# Patient Record
Sex: Female | Born: 1985 | Hispanic: No | Marital: Single
Health system: Southern US, Community
[De-identification: ages and names within clinical notes are randomized; demographics above are authoritative.]

---
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
## 2020-03-09 ENCOUNTER — Ambulatory Visit: Payer: Self-pay | Attending: Internal Medicine

## 2020-03-09 DIAGNOSIS — Z23 Encounter for immunization: Secondary | ICD-10-CM

## 2020-03-09 NOTE — Progress Notes (Signed)
   Covid-19 Vaccination Clinic  Name:  Ayza Ripoll    MRN: [id number] DOB: 1986/07/30  03/09/2020  [name] was observed post Covid-19 immunization for 15 minutes without incident. She was provided with Vaccine Information Sheet and instruction to access the V-Safe system.   [name] was instructed to call 911 with any severe reactions post vaccine: Marland Kitchen Difficulty breathing  . Swelling of face and throat  . A fast heartbeat  . A bad rash all over body  . Dizziness and weakness   Immunizations Administered    Name Date Dose VIS Date Route   Pfizer COVID-19 Vaccine 03/09/2020  8:41 AM 0.3 mL 12/08/2019 Intramuscular   Manufacturer: [employer], [employer]   Lot: [id number]   NDC: [address]-5

## 2020-04-01 ENCOUNTER — Ambulatory Visit: Payer: Self-pay | Attending: Internal Medicine

## 2020-04-01 ENCOUNTER — Ambulatory Visit: Payer: Self-pay

## 2020-04-01 DIAGNOSIS — Z23 Encounter for immunization: Secondary | ICD-10-CM

## 2020-04-01 NOTE — Progress Notes (Signed)
   Covid-19 Vaccination Clinic  Name:  April Hartman    MRN: [id number] DOB: [birth year]  04/01/2020  [name] was observed post Covid-19 immunization for 15 minutes without incident. She was provided with Vaccine Information Sheet and instruction to access the V-Safe system.   [name] was instructed to call 911 with any severe reactions post vaccine: Marland Kitchen Difficulty breathing  . Swelling of face and throat  . A fast heartbeat  . A bad rash all over body  . Dizziness and weakness   Immunizations Administered    Name Date Dose VIS Date Route   Pfizer COVID-19 Vaccine 04/01/2020  3:42 PM 0.3 mL 12/08/2019 Intramuscular   Manufacturer: [employer], [employer]   Lot: [id number]   NDC: [address]-7

## 2021-12-02 ENCOUNTER — Other Ambulatory Visit: Payer: Self-pay | Admitting: Nurse Practitioner

## 2021-12-02 DIAGNOSIS — N852 Hypertrophy of uterus: Secondary | ICD-10-CM

## 2021-12-02 DIAGNOSIS — Z30431 Encounter for routine checking of intrauterine contraceptive device: Secondary | ICD-10-CM

## 2021-12-12 ENCOUNTER — Emergency Department (HOSPITAL_COMMUNITY)
Admission: EM | Admit: 2021-12-12 | Discharge: 2021-12-12 | Disposition: A | Discharge status: Home / Self Care | Attending: Emergency Medicine | Admitting: Emergency Medicine

## 2021-12-12 ENCOUNTER — Other Ambulatory Visit: Payer: Self-pay

## 2021-12-12 DIAGNOSIS — S0990XA Unspecified injury of head, initial encounter: Secondary | ICD-10-CM | POA: Diagnosis present

## 2021-12-12 DIAGNOSIS — Z23 Encounter for immunization: Secondary | ICD-10-CM | POA: Diagnosis not present

## 2021-12-12 DIAGNOSIS — W228XXA Striking against or struck by other objects, initial encounter: Secondary | ICD-10-CM | POA: Diagnosis not present

## 2021-12-12 DIAGNOSIS — Y990 Civilian activity done for income or pay: Secondary | ICD-10-CM | POA: Insufficient documentation

## 2021-12-12 DIAGNOSIS — S0101XA Laceration without foreign body of scalp, initial encounter: Secondary | ICD-10-CM | POA: Diagnosis not present

## 2021-12-12 DIAGNOSIS — S0181XA Laceration without foreign body of other part of head, initial encounter: Secondary | ICD-10-CM

## 2021-12-12 MED ORDER — LIDOCAINE-EPINEPHRINE (PF) 2 %-1:200000 IJ SOLN
10.0000 mL | Freq: Once | INTRAMUSCULAR | Status: AC
  Administered 2021-12-12: 10 mL
  Filled 2021-12-12: qty 20

## 2021-12-12 MED ORDER — TETANUS-DIPHTH-ACELL PERTUSSIS 5-2.5-18.5 LF-MCG/0.5 IM SUSY
0.5000 mL | PREFILLED_SYRINGE | Freq: Once | INTRAMUSCULAR | Status: AC
  Administered 2021-12-12: 0.5 mL via INTRAMUSCULAR
  Filled 2021-12-12: qty 0.5

## 2021-12-12 NOTE — Discharge Instructions (Signed)
Return for any problem.   The sutures placed today need to be removed in approximately 5 to 7 days.  This can be done either here in the ED or at your regular care provider or at an urgent care.
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
## 2021-12-18 ENCOUNTER — Ambulatory Visit
Admission: RE | Admit: 2021-12-18 | Discharge: 2021-12-18 | Disposition: A | Discharge status: Home / Self Care | Payer: 59 | Source: Ambulatory Visit | Attending: Nurse Practitioner | Admitting: Nurse Practitioner

## 2021-12-18 DIAGNOSIS — Z30431 Encounter for routine checking of intrauterine contraceptive device: Secondary | ICD-10-CM

## 2021-12-18 DIAGNOSIS — N852 Hypertrophy of uterus: Secondary | ICD-10-CM

## 2022-03-20 ENCOUNTER — Ambulatory Visit
Admission: RE | Admit: 2022-03-20 | Discharge: 2022-03-20 | Disposition: A | Discharge status: Home / Self Care | Payer: BC Managed Care – PPO | Source: Ambulatory Visit | Attending: Family Medicine | Admitting: Family Medicine

## 2022-03-20 ENCOUNTER — Other Ambulatory Visit: Payer: Self-pay | Admitting: Family Medicine

## 2022-03-20 DIAGNOSIS — M546 Pain in thoracic spine: Secondary | ICD-10-CM

## 2022-03-20 DIAGNOSIS — M542 Cervicalgia: Secondary | ICD-10-CM

## 2022-03-20 DIAGNOSIS — M545 Low back pain, unspecified: Secondary | ICD-10-CM

## 2022-12-22 IMAGING — CR DG CERVICAL SPINE COMPLETE 4+V
5 series · 5 of 5 positions shown · non-contrast
Comparison: None.

CLINICAL DATA: Cervical spine pain.  Bilateral shoulder pain.

EXAM:
CERVICAL SPINE - COMPLETE 4+ VIEW

[w cervical spine lat]
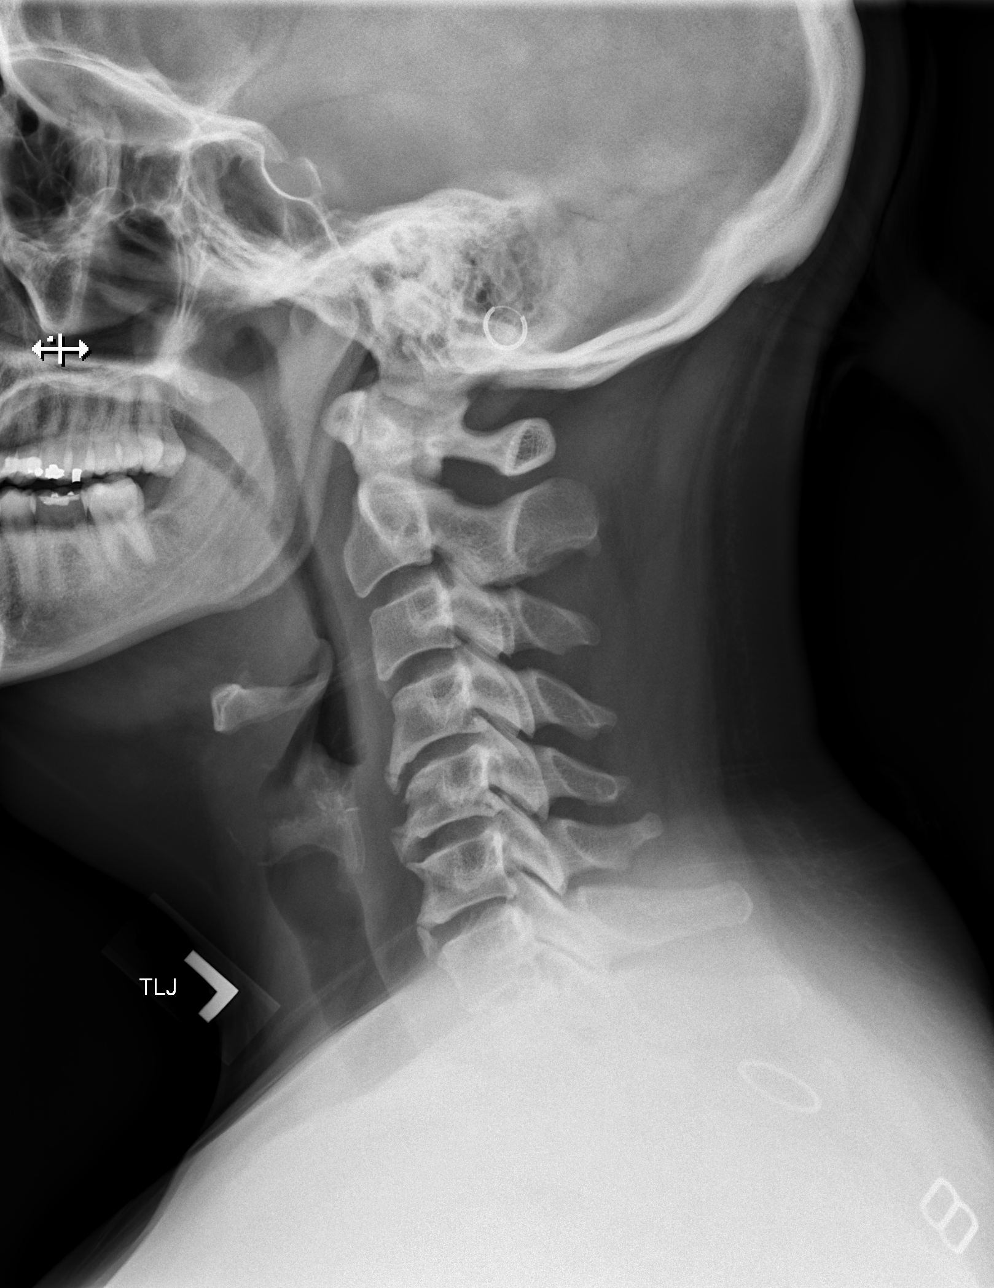

[w cervical spine ap_obl (1 of 2)]
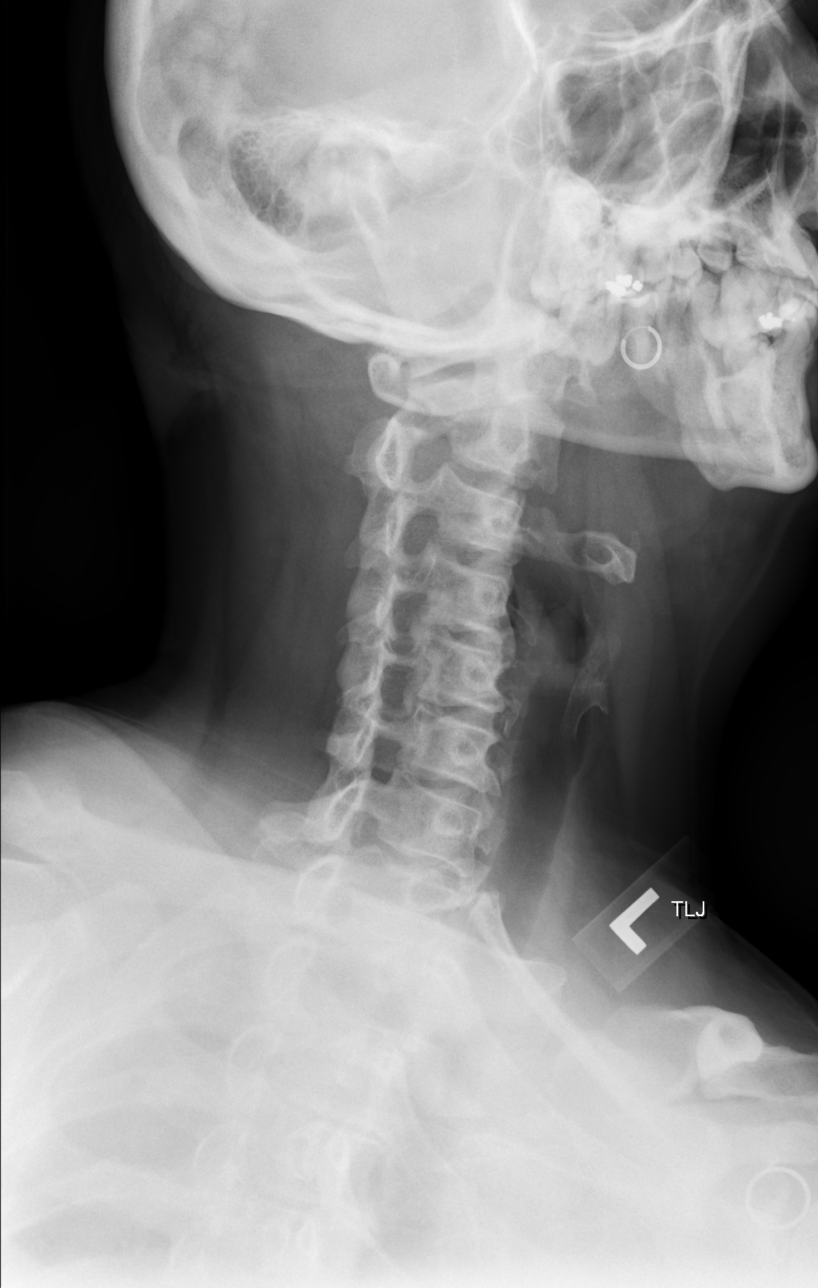

[w cervical spine ap_obl (2 of 2)]
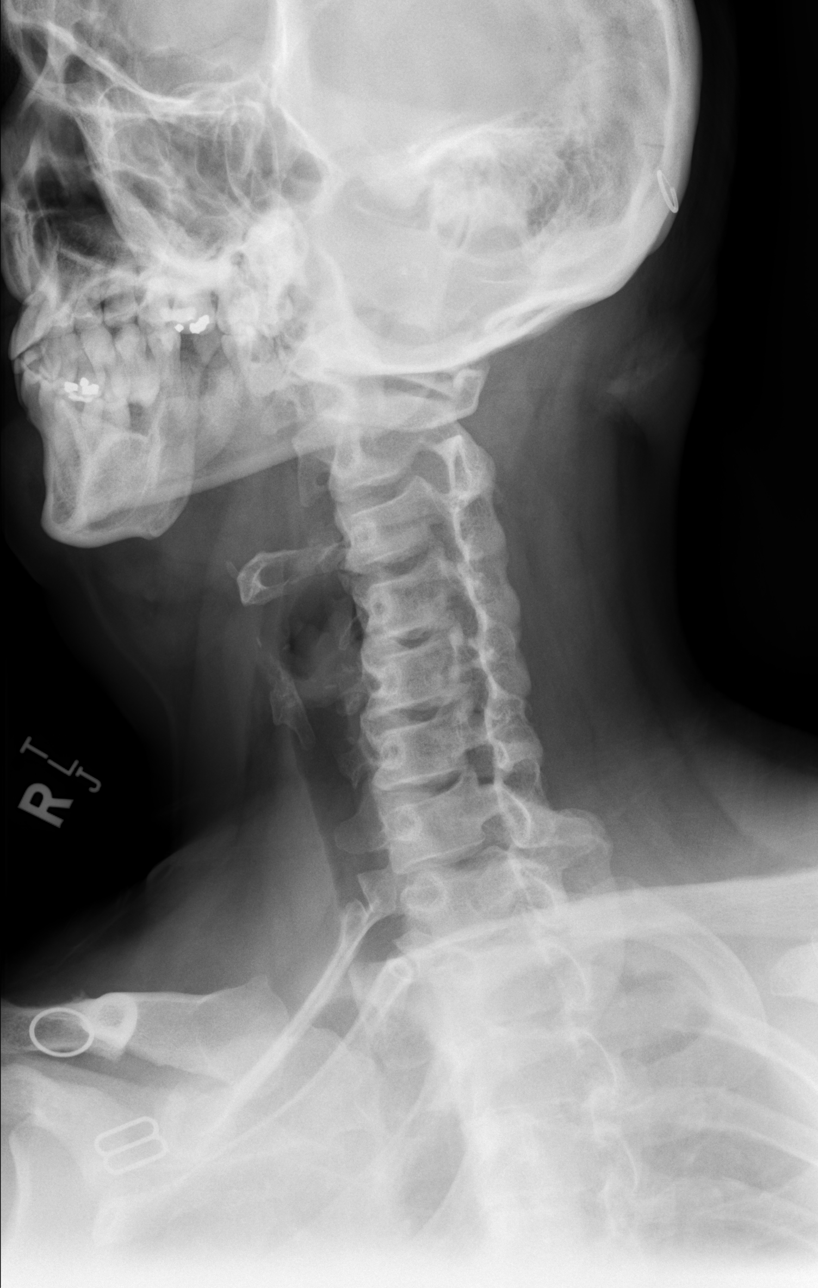

[w cervical spine ap]
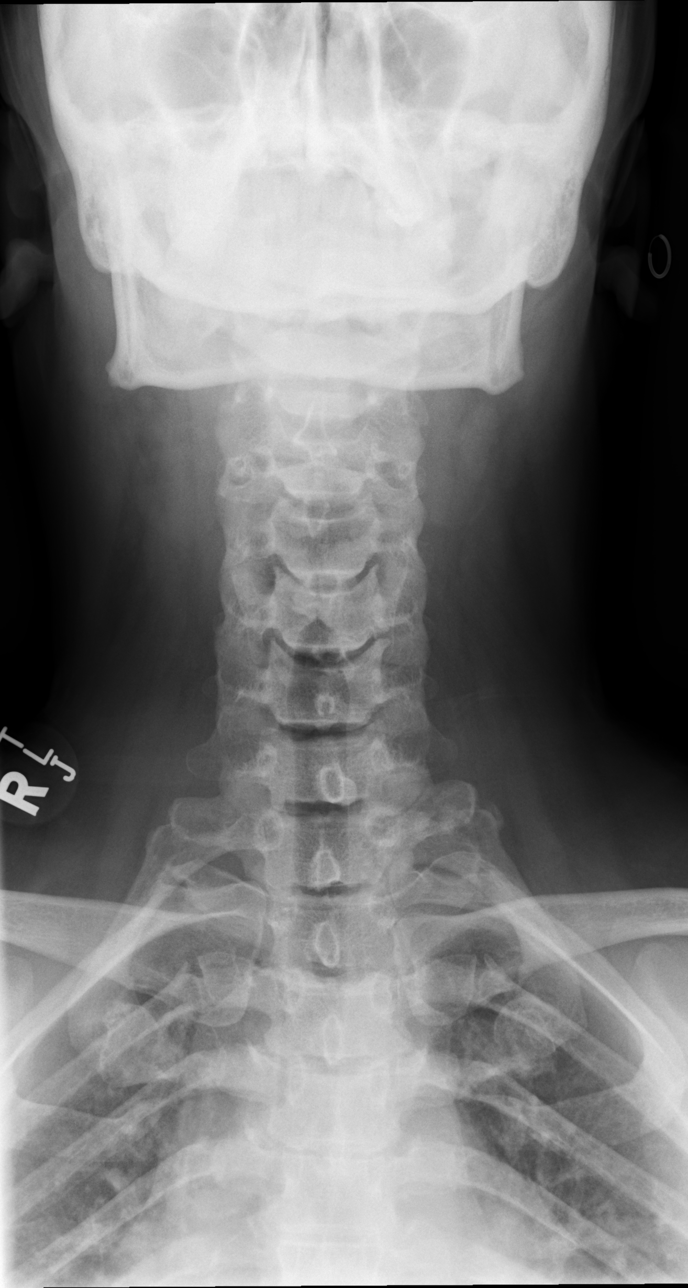

[w cervical spine odontoid]
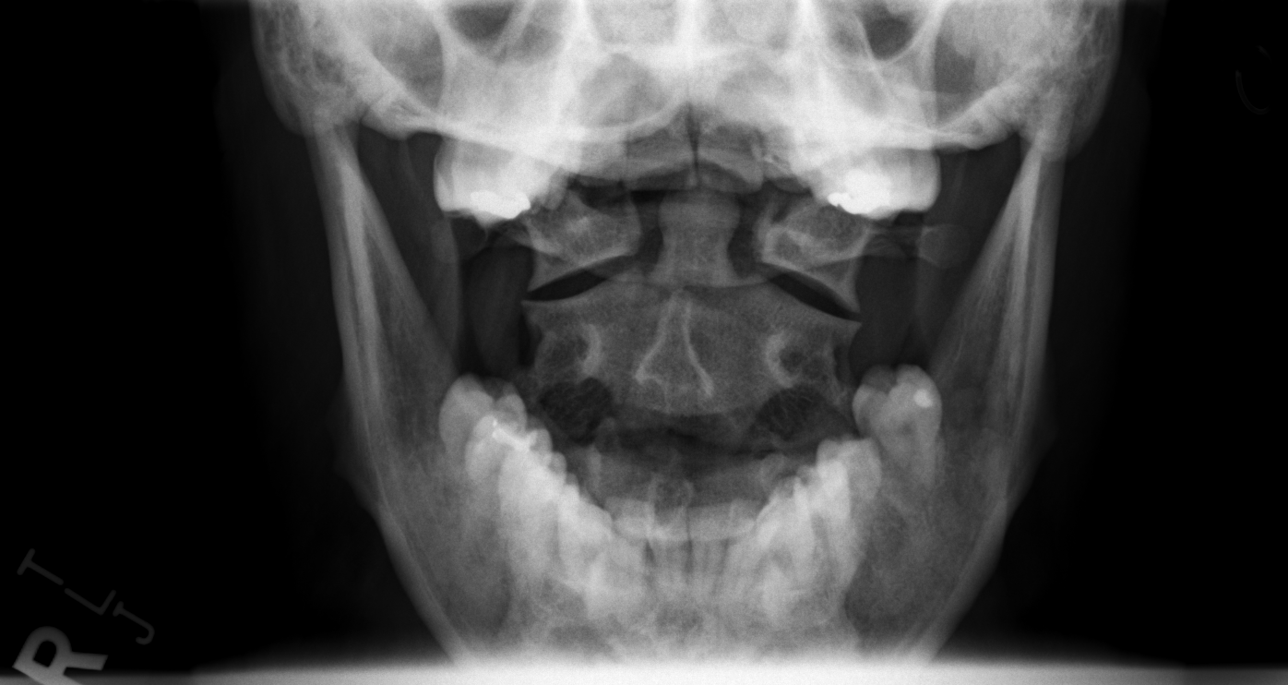

[5 of 5 positions shown; findings below may reference images not displayed]

FINDINGS: There is no evidence of cervical spine fracture or prevertebral soft
tissue swelling. Straightening of the cervical spine. Disc space
narrowing with anterior osteophytes prominent at C4-C5, C5-C6 and
C6-C7.
IMPRESSION: 1.  No evidence of fracture or subluxation.

2. Degenerate disc disease of the cervical spine with prominent
anterior osteophytes at C4-C5, C5-C6 and C6-C7.

## 2024-10-16 ENCOUNTER — Other Ambulatory Visit (HOSPITAL_COMMUNITY)
Admission: RE | Admit: 2024-10-16 | Discharge: 2024-10-16 | Disposition: A | Discharge status: Home / Self Care | Source: Ambulatory Visit | Attending: Obstetrics | Admitting: Obstetrics

## 2024-10-16 ENCOUNTER — Encounter: Payer: Self-pay | Admitting: Obstetrics

## 2024-10-16 ENCOUNTER — Ambulatory Visit (INDEPENDENT_AMBULATORY_CARE_PROVIDER_SITE_OTHER): Admitting: Obstetrics

## 2024-10-16 VITALS — BP 136/80 | HR 75 | Ht 66.5 in | Wt 254.5 lb

## 2024-10-16 DIAGNOSIS — Z01419 Encounter for gynecological examination (general) (routine) without abnormal findings: Secondary | ICD-10-CM | POA: Diagnosis not present

## 2024-10-16 DIAGNOSIS — E6609 Other obesity due to excess calories: Secondary | ICD-10-CM | POA: Diagnosis not present

## 2024-10-16 DIAGNOSIS — Z30011 Encounter for initial prescription of contraceptive pills: Secondary | ICD-10-CM

## 2024-10-16 DIAGNOSIS — R102 Pelvic and perineal pain unspecified side: Secondary | ICD-10-CM | POA: Diagnosis not present

## 2024-10-16 DIAGNOSIS — N946 Dysmenorrhea, unspecified: Secondary | ICD-10-CM

## 2024-10-16 DIAGNOSIS — Z3042 Encounter for surveillance of injectable contraceptive: Secondary | ICD-10-CM

## 2024-10-16 MED ORDER — NORETHINDRONE-ETH ESTRADIOL 1-35 MG-MCG PO TABS: 1.0000 | ORAL_TABLET | Freq: Every day | ORAL | 11 refills | Status: AC

## 2024-10-16 MED ORDER — IBUPROFEN 800 MG PO TABS: 800.0000 mg | ORAL_TABLET | Freq: Three times a day (TID) | ORAL | 5 refills | Status: AC | PRN

## 2024-10-16 NOTE — Progress Notes (Signed)
 Subjective:        April Hartman is a 38 y.o. female here for a routine exam.  Current complaints: Heavy, painful and irregular periods  Undesirable weight gain.  Contraception:  Depo Provera Injections.    Personal health questionnaire:  Is patient April Hartman, have a family history of breast and/or ovarian cancer: no Is there a family history of uterine cancer diagnosed at age < 27, gastrointestinal cancer, urinary tract cancer, family member who is a Personnel officer syndrome-associated carrier: no Is the patient overweight and hypertensive, family history of diabetes, personal history of gestational diabetes, preeclampsia or PCOS: no Is patient over 7, have PCOS,  family history of premature CHD under age 34, diabetes, smoke, have hypertension or peripheral artery disease:  no At any time, has a partner hit, kicked or otherwise hurt or frightened you?: no Over the past 2 weeks, have you felt down, depressed or hopeless?: no Over the past 2 weeks, have you felt little interest or pleasure in doing things?:no   Gynecologic History Patient's last menstrual period was 09/19/2024 (approximate). Contraception: Depo-Provera injections Last Pap: 3-4 yrs ago. Results were: normal Last mammogram: 2025. Results were: normal  Obstetric History OB History  Gravida Para Term Preterm AB Living  4 4 4   4   SAB IAB Ectopic Multiple Live Births          # Outcome Date GA Lbr Len/2nd Weight Sex Type Anes PTL Lv  4 Term           3 Term           2 Term           1 Term             History reviewed. No pertinent past medical history.  History reviewed. No pertinent surgical history.   Current Outpatient Medications:    ibuprofen (ADVIL) 800 MG tablet, Take 1 tablet (800 mg total) by mouth every 8 (eight) hours as needed., Disp: 30 tablet, Rfl: 5   levothyroxine (SYNTHROID) 25 MCG tablet, Take 25 mcg by mouth daily before breakfast., Disp: , Rfl:    metFORMIN (GLUCOPHAGE) 500 MG  tablet, Take 1 tablet by mouth 2 (two) times daily with a meal., Disp: , Rfl:    norethindrone-ethinyl estradiol 1/35 (ORTHO-NOVUM) tablet, Take 1 tablet by mouth daily., Disp: 28 tablet, Rfl: 11 Not on File  Social History   Tobacco Use   Smoking status: Never   Smokeless tobacco: Never  Substance Use Topics   Alcohol use: Not Currently    History reviewed. No pertinent family history.    Review of Systems  Constitutional: negative for fatigue and weight loss Respiratory: negative for cough and wheezing Cardiovascular: negative for chest pain, fatigue and palpitations Gastrointestinal: negative for abdominal pain and change in bowel habits Musculoskeletal:negative for myalgias Neurological: negative for gait problems and tremors Behavioral/Psych: negative for abusive relationship, depression Endocrine: negative for temperature intolerance    Genitourinary: positive for abnormal menstrual periods.  Negative for genital lesions, hot flashes, sexual problems and vaginal discharge Integument/breast: negative for breast lump, breast tenderness, nipple discharge and skin lesion(s)    Objective:       BP 136/80   Pulse 75   Ht 5' 6.5 (1.689 m)   Wt 254 lb 8 oz (115.4 kg)   LMP 09/19/2024 (Approximate)   BMI 40.46 kg/m  General:   Alert and no distress  Skin:   no rash or abnormalities  Lungs:  clear to auscultation bilaterally  Heart:   regular rate and rhythm, S1, S2 normal, no murmur, click, rub or gallop  Breasts:   normal without suspicious masses, skin or nipple changes or axillary nodes  Abdomen:  normal findings: no organomegaly, soft, non-tender and no hernia  Pelvis:  External genitalia: normal general appearance Urinary system: urethral meatus normal and bladder without fullness, nontender Vaginal: normal without tenderness, induration or masses Cervix: normal appearance Adnexa: normal bimanual exam Uterus: anteverted and non-tender, normal size   Lab  Review Urine pregnancy test Labs reviewed yes Radiologic studies reviewed yes  I have spent a total of 20 minutes of face-to-face time, excluding clinical staff time, reviewing notes and preparing to see patient, ordering tests and/or medications, and counseling the patient.   Assessment:    1. Encounter for gynecological examination with Papanicolaou smear of cervix (Primary) Rx: - Cytology - PAP( [place])  2. Dysmenorrhea Rx: - ibuprofen (ADVIL) 800 MG tablet; Take 1 tablet (800 mg total) by mouth every 8 (eight) hours as needed.  Dispense: 30 tablet; Refill: 5  3. Pelvic pain Rx: - US  PELVIC COMPLETE WITH TRANSVAGINAL; Future  4. Encounter for surveillance of injectable contraceptive - undesirable weight gain - options discussed.  Wants to switch to OCP's  5. Encounter for initial prescription of contraceptive pills Rx: - norethindrone-ethinyl estradiol 1/35 (ORTHO-NOVUM) tablet; Take 1 tablet by mouth daily.  Dispense: 28 tablet; Refill: 11  6. Obesity due to excess calories with serious comorbidity, unspecified class - weight reduction with the aid of dietary changes, exercise and behavioral modification recommended    Plan:    Education reviewed: calcium supplements, depression evaluation, low fat, low cholesterol diet, safe sex/STD prevention, self breast exams, and weight bearing exercise. Contraception: OCP (estrogen/progesterone). Follow up in: 1 year.   Meds ordered this encounter  Medications   ibuprofen (ADVIL) 800 MG tablet    Sig: Take 1 tablet (800 mg total) by mouth every 8 (eight) hours as needed.    Dispense:  30 tablet    Refill:  5   norethindrone-ethinyl estradiol 1/35 (ORTHO-NOVUM) tablet    Sig: Take 1 tablet by mouth daily.    Dispense:  28 tablet    Refill:  11   Orders Placed This Encounter  Procedures   US  PELVIC COMPLETE WITH TRANSVAGINAL    Standing Status:   Future    Expiration Date:   10/16/2025    Reason for Exam (SYMPTOM  OR  DIAGNOSIS REQUIRED):   Pelvic pain, left adnexa    Preferred imaging location?:   [facility]    CARLIN RONAL CENTERS, MD, FACOG Attending Obstetrician & Gynecologist, [facility] for [facility], [facility], [place]  10/16/2024

## 2024-10-16 NOTE — Progress Notes (Signed)
  error

## 2024-10-16 NOTE — Progress Notes (Deleted)
 Patient ID: April Hartman, female   DOB: 10/15/1986, 38 y.o.   MRN: [id number]  Chief Complaint  Patient presents with   Dysmenorrhea    HPI April Hartman is a 38 y.o. female.  Complains of heavy, painful 2-3 day irregular periods.  Contraception:  Depo Provera Injections   PMH:  Pre Diabetes, Hypothyroidism HPI  History reviewed. No pertinent past medical history.  History reviewed. No pertinent surgical history.  History reviewed. No pertinent family history.  Social History Social History   Tobacco Use   Smoking status: Never   Smokeless tobacco: Never  Substance Use Topics   Alcohol use: Not Currently   Drug use: Never    Not on File  Current Outpatient Medications  Medication Sig Dispense Refill   ibuprofen (ADVIL) 800 MG tablet Take 1 tablet (800 mg total) by mouth every 8 (eight) hours as needed. 30 tablet 5   levothyroxine (SYNTHROID) 25 MCG tablet Take 25 mcg by mouth daily before breakfast.     metFORMIN (GLUCOPHAGE) 500 MG tablet Take 1 tablet by mouth 2 (two) times daily with a meal.     norethindrone-ethinyl estradiol 1/35 (ORTHO-NOVUM) tablet Take 1 tablet by mouth daily. 28 tablet 11   No current facility-administered medications for this visit.    Review of Systems Review of Systems Constitutional: negative for fatigue and weight loss Respiratory: negative for cough and wheezing Cardiovascular: negative for chest pain, fatigue and palpitations Gastrointestinal: negative for abdominal pain and change in bowel habits Genitourinary: positive for heavy, painful, irregular periods Integument/breast: negative for nipple discharge Musculoskeletal:negative for myalgias Neurological: negative for gait problems and tremors Behavioral/Psych: negative for abusive relationship, depression Endocrine: negative for temperature intolerance      Blood pressure 136/80, pulse 75, height 5' 6.5 (1.689 m), weight 254 lb 8 oz (115.4 kg), last  menstrual period 09/19/2024.  Physical Exam Physical Exam General:   Alert and no distress  Skin:   no rash or abnormalities  Lungs:   clear to auscultation bilaterally  Heart:   regular rate and rhythm, S1, S2 normal, no murmur, click, rub or gallop  Breasts:   normal without suspicious masses, skin or nipple changes or axillary nodes  Abdomen:  normal findings: no organomegaly, soft, non-tender and no hernia  Pelvis:  External genitalia: normal general appearance Urinary system: urethral meatus normal and bladder without fullness, nontender Vaginal: normal without tenderness, induration or masses Cervix: normal appearance.  ANTERIOR.   Adnexa: normal bimanual exam Uterus: anteverted and non-tender, normal size   I have spent a total of 20 minutes of face-to-face time, excluding clinical staff time, reviewing notes and preparing to see patient, ordering tests and/or medications, and counseling the patient.   Data Reviewed Labs  Assessment     1. Encounter for gynecological examination with Papanicolaou smear of cervix (Primary) Rx: - Cytology - PAP( [place])  2. Dysmenorrhea Rx: - ibuprofen (ADVIL) 800 MG tablet; Take 1 tablet (800 mg total) by mouth every 8 (eight) hours as needed.  Dispense: 30 tablet; Refill: 5  3. Pelvic pain Rx: - US  PELVIC COMPLETE WITH TRANSVAGINAL; Future  4. Encounter for surveillance of injectable contraceptive - heavy, painful and irregular periods - undesirable weight gain - options discussed.  Wants to switch to the pill  5. Encounter for initial prescription of contraceptive pills Rx: - norethindrone-ethinyl estradiol 1/35 (ORTHO-NOVUM) tablet; Take 1 tablet by mouth daily.  Dispense: 28 tablet; Refill: 11  6. 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    Dispense:  30 tablet    Refill:  5   norethindrone-ethinyl estradiol 1/35 (ORTHO-NOVUM) tablet    Sig: Take 1 tablet by mouth daily.    Dispense:  28 tablet    Refill:  11       CARLIN RONAL CENTERS, MD, FACOG Attending Obstetrician & Gynecologist, [facility] for [facility], [facility], [place] 10/16/2024

## 2024-10-16 NOTE — Progress Notes (Signed)
 Pt presents for heavy bleeding and painful periods. Pt states that her last period July 25, she had heavy bleeding and pain every other day for 2 weeks. One day she would bleed heavy and the next day would be nothing. Sept got her period for one day, and nothing for Oct. So far. Pt is on depo. Declines pap today

## 2024-10-18 LAB — CYTOLOGY - PAP
Adequacy: ABSENT
Comment: NEGATIVE
Comment: NEGATIVE
Comment: NEGATIVE
Diagnosis: NEGATIVE
HPV 16: NEGATIVE
HPV 18 / 45: NEGATIVE
High risk HPV: POSITIVE — AB

## 2024-10-20 ENCOUNTER — Ambulatory Visit (HOSPITAL_BASED_OUTPATIENT_CLINIC_OR_DEPARTMENT_OTHER)
Admission: RE | Admit: 2024-10-20 | Discharge: 2024-10-20 | Disposition: A | Discharge status: Home / Self Care | Source: Ambulatory Visit | Attending: Obstetrics | Admitting: Obstetrics

## 2024-10-20 DIAGNOSIS — R102 Pelvic and perineal pain unspecified side: Secondary | ICD-10-CM

## 2024-10-23 ENCOUNTER — Ambulatory Visit (HOSPITAL_BASED_OUTPATIENT_CLINIC_OR_DEPARTMENT_OTHER)
Admission: RE | Admit: 2024-10-23 | Discharge: 2024-10-23 | Disposition: A | Discharge status: Home / Self Care | Source: Ambulatory Visit | Attending: Obstetrics | Admitting: Obstetrics

## 2024-10-23 DIAGNOSIS — R102 Pelvic and perineal pain unspecified side: Secondary | ICD-10-CM | POA: Insufficient documentation
